# Patient Record
Sex: Male | Born: 2000
Health system: Southern US, Community
[De-identification: ages and names within clinical notes are randomized; demographics above are authoritative.]

---
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
## 2021-12-30 ENCOUNTER — Ambulatory Visit: Payer: 59 | Admitting: Podiatry

## 2021-12-30 ENCOUNTER — Encounter: Payer: Self-pay | Admitting: Podiatry

## 2021-12-30 DIAGNOSIS — L600 Ingrowing nail: Secondary | ICD-10-CM | POA: Diagnosis not present

## 2021-12-30 NOTE — Progress Notes (Signed)
  Subjective:  Patient ID: Darrell Andrews, male    DOB: 03/31/2001,   MRN: [id number]  Chief Complaint  Patient presents with   Ingrown Toenail     Left great toe ingrown     21 y.o. male presents for concern of left great ingrown that has been present for about a month and worsened in the last week. Relates swelling has gone down as he has been using neosporin but still has tenderness and relates some previous discharge . Denies any other pedal complaints. Denies n/v/f/c.   History reviewed. No pertinent past medical history.  Objective:  Physical Exam: Vascular: DP/PT pulses 2/4 bilateral. CFT <3 seconds. Normal hair growth on digits. No edema.  Skin. No lacerations or abrasions bilateral feet. Left hallux medial border incurvated with mild erythema no edema or purulence noted.  Musculoskeletal: MMT 5/5 bilateral lower extremities in DF, PF, Inversion and Eversion. Deceased ROM in DF of ankle joint.  Neurological: Sensation intact to light touch.   Assessment:   1. Ingrown nail of great toe of left foot      Plan:  Patient was evaluated and treated and all questions answered. Patient requesting removal of ingrown nail today. Procedure below.  Discussed procedure and post procedure care and patient expressed understanding.  Will follow-up in 2 weeks for nail check or sooner if any problems arise.    Procedure:  Procedure: partial Nail Avulsion of left hallux medial nail border.  Surgeon: Louann Sjogren, DPM  Pre-op Dx: Ingrown toenail without infection Post-op: Same  Place of Surgery: Office exam room.  Indications for surgery: Painful and ingrown toenail.    The patient is requesting removal of nail with chemical matrixectomy. Risks and complications were discussed with the patient for which they understand and written consent was obtained. Under sterile conditions a total of 3 mL of  1% lidocaine plain was infiltrated in a hallux block fashion. Once anesthetized, the skin  was prepped in sterile fashion. A tourniquet was then applied. Next the medial aspect of hallux nail border was then sharply excised making sure to remove the entire offending nail border.  Next phenol was then applied under standard conditions and copiously irrigated. Silvadene was applied. A dry sterile dressing was applied. After application of the dressing the tourniquet was removed and there is found to be an immediate capillary refill time to the digit. The patient tolerated the procedure well without any complications. Post procedure instructions were discussed the patient for which he verbally understood. Follow-up in two weeks for nail check or sooner if any problems are to arise. Discussed signs/symptoms of infection and directed to call the office immediately should any occur or go directly to the emergency room. In the meantime, encouraged to call the office with any questions, concerns, changes symptoms.   Louann Sjogren, DPM

## 2021-12-30 NOTE — Patient Instructions (Signed)

## 2021-12-31 ENCOUNTER — Ambulatory Visit: Payer: 59 | Admitting: Podiatry

## 2022-07-28 ENCOUNTER — Ambulatory Visit: Payer: 59 | Admitting: Podiatry
